# Patient Record
Sex: Male | Born: 2000 | Race: White | Hispanic: No | Marital: Single
Health system: Midwestern US, Community
[De-identification: ages and names within clinical notes are randomized; demographics above are authoritative.]

## PROBLEM LIST (undated history)

## (undated) HISTORY — PX: EYE SURGERY: SHX253

---
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
## 2021-05-09 IMAGING — CT CT ABD-PELV W/ CM
2 of 4 series · 16 of 46 positions shown, 18 images · IV contrast (Omnipaque or Isovue)
Comparison: None.

CLINICAL DATA: 19-year-old male with right lower quadrant abdominal
pain on set tonight.

EXAM:
CT ABDOMEN AND PELVIS WITH CONTRAST
TECHNIQUE: Multidetector CT imaging of the abdomen and pelvis was performed
using the standard protocol following bolus administration of
intravenous contrast.
CONTRAST:  100mL OMNIPAQUE IOHEXOL 300 MG/ML  SOLN

[Series 2: axial st · axial · 0.62mm/px · z∈[-628,-253]mm · 13 of 83 slices shown, 15 images]
[im 4/83  soft-tissue]
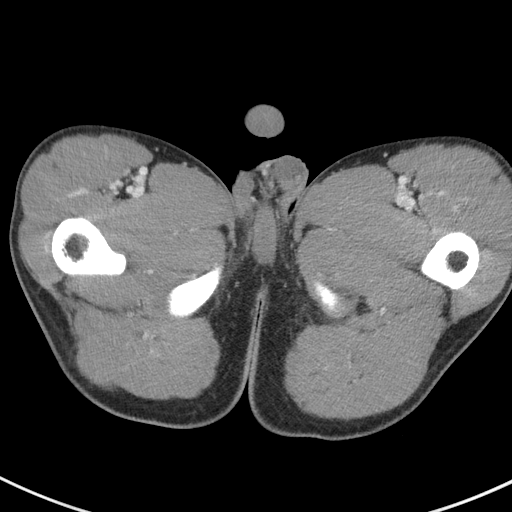
[im 4/83  bone]
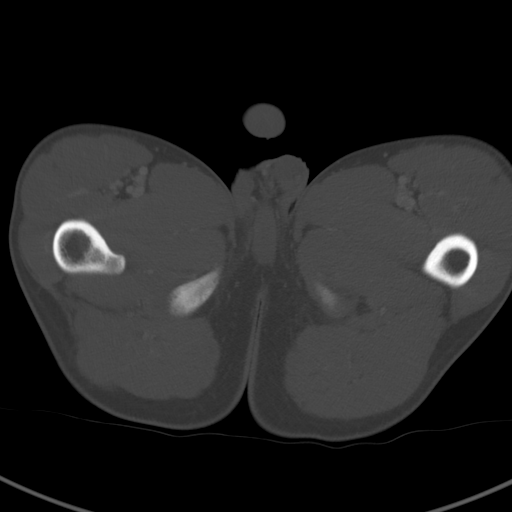
[im 11/83  soft-tissue]
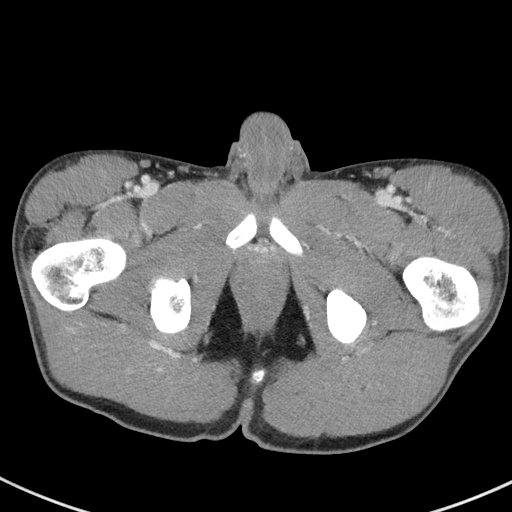
[im 18/83  soft-tissue]
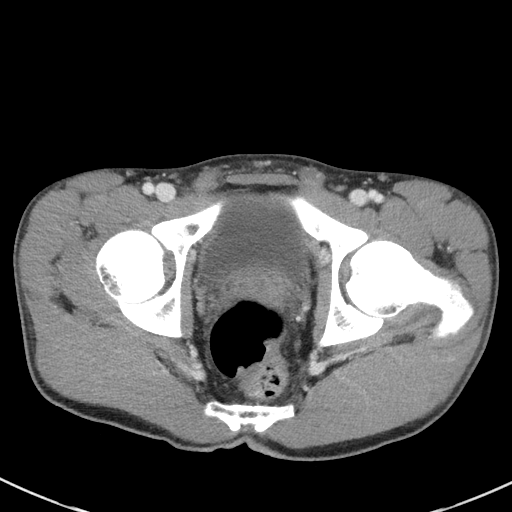
[im 24/83  soft-tissue]
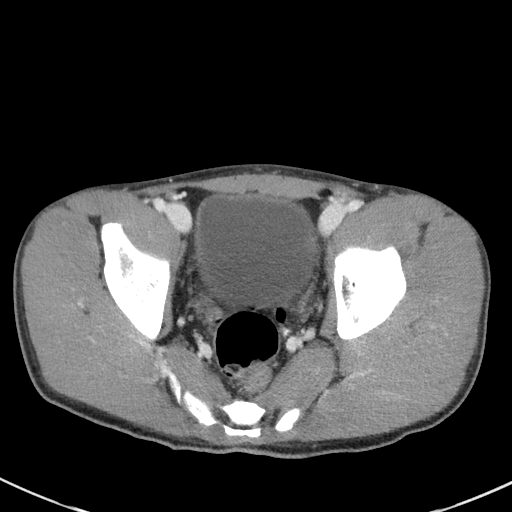
[im 28/83  soft-tissue]
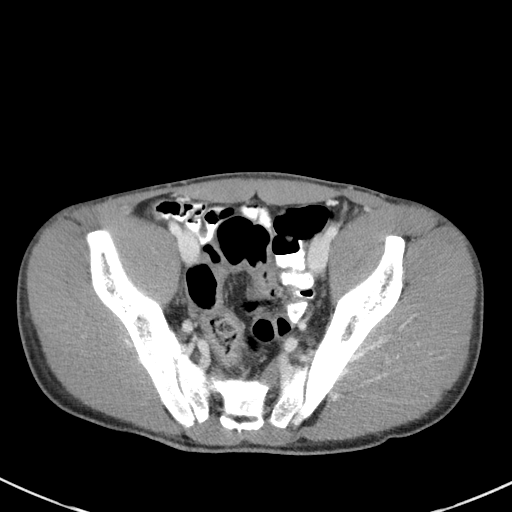
[im 35/83  soft-tissue]
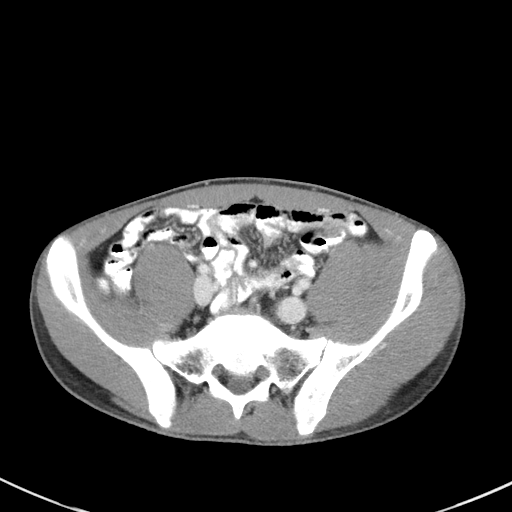
[im 42/83  soft-tissue]
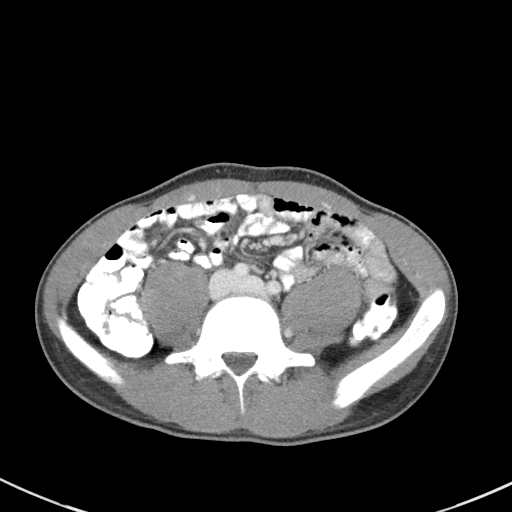
[im 48/83  soft-tissue]
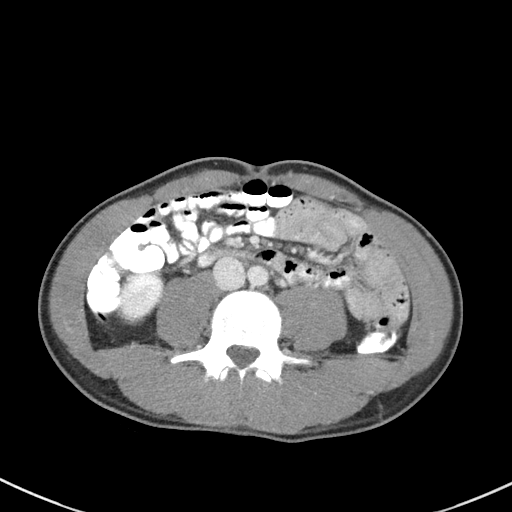
[im 55/83  soft-tissue]
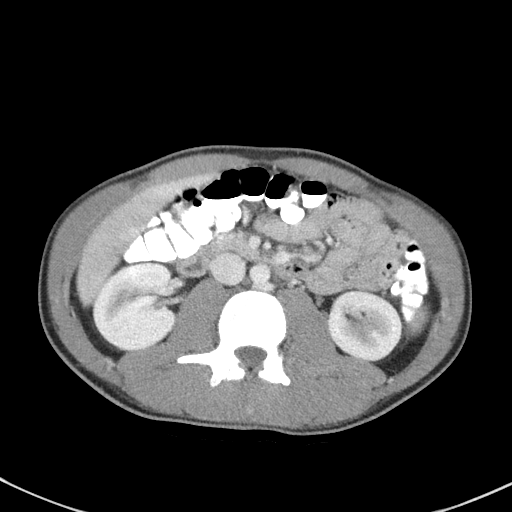
[im 55/83  bone]
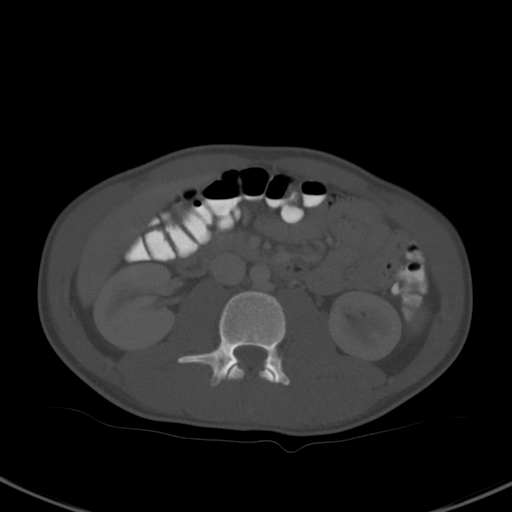
[im 59/83  soft-tissue]
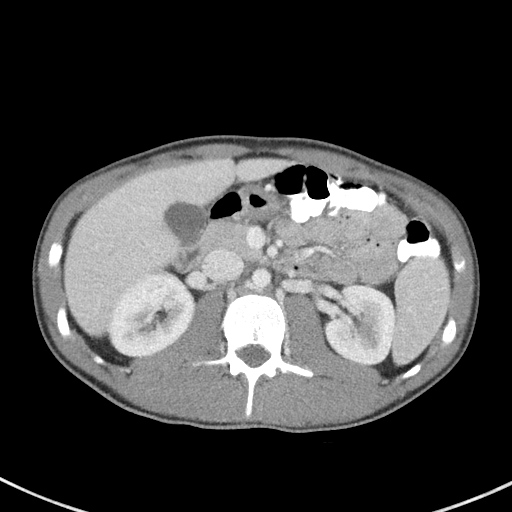
[im 65/83  soft-tissue]
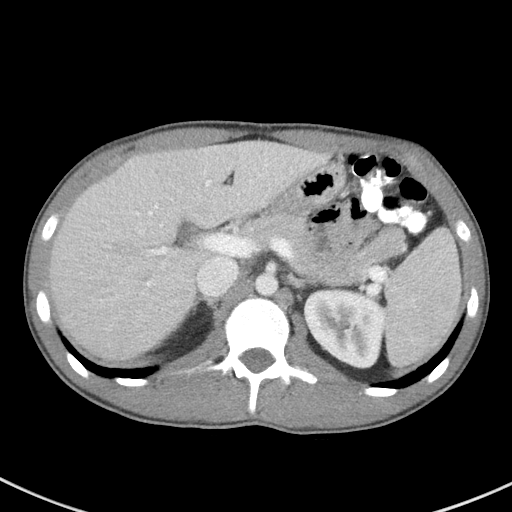
[im 72/83  soft-tissue]
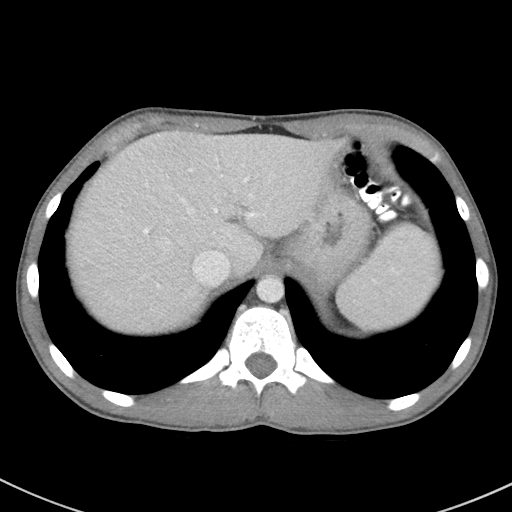
[im 79/83  soft-tissue]
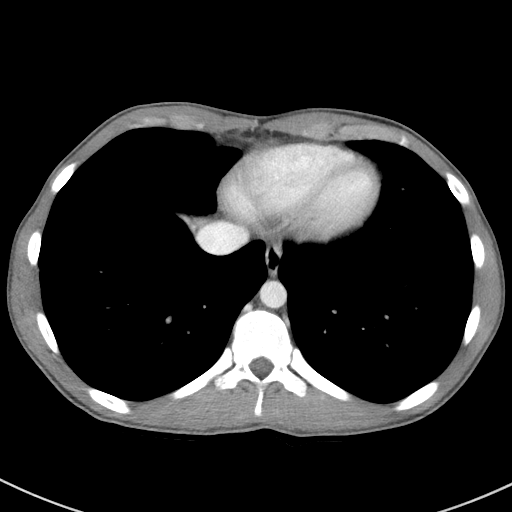

[Series 5: coronal st · coronal · 0.65mm/px · 3 of 81 slices shown]
[im 27/81  soft-tissue]
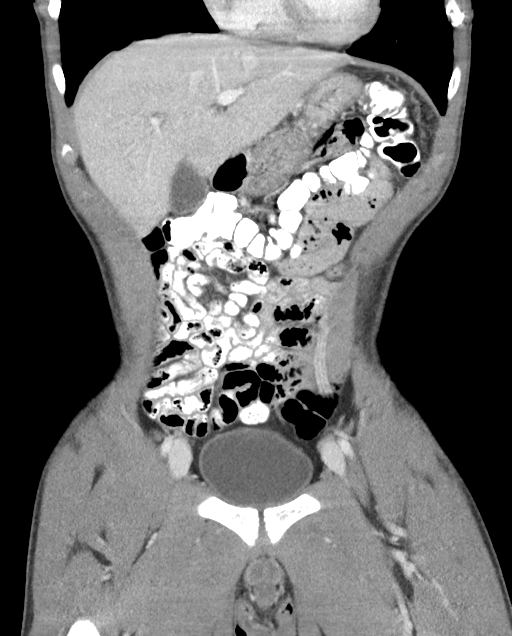
[im 36/81  soft-tissue]
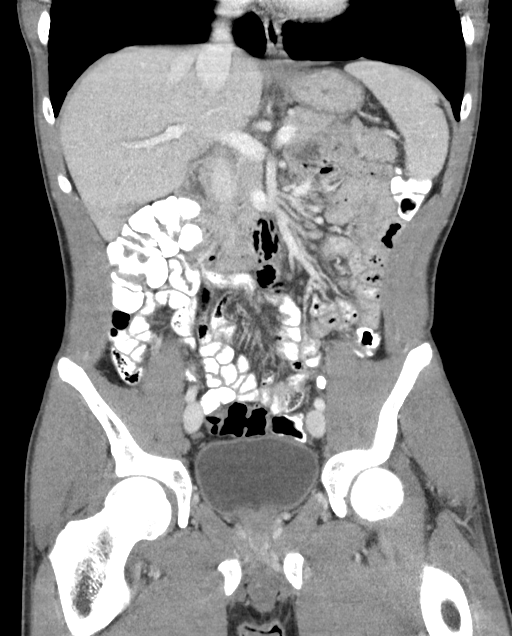
[im 45/81  soft-tissue]
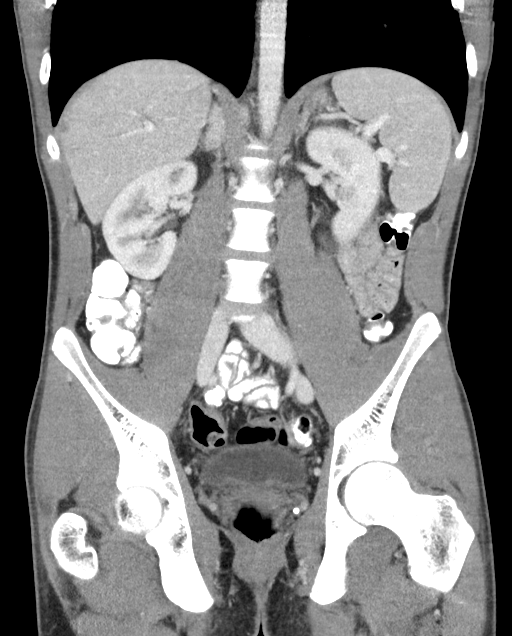

[16 of 46 positions shown; findings below may reference images not displayed]

FINDINGS: Lower chest: Negative; tiny postinflammatory nodule in the right
costophrenic angle on series 4, image 11.

Hepatobiliary: Negative; small peripheral low-density area in the
right liver (series 5, image 44) is too small to characterize but
most likely a benign cyst or hemangioma. Negative gallbladder. No
bile duct enlargement.

Pancreas: Negative.

Spleen: Negative.

Adrenals/Urinary Tract: Normal adrenal glands.

Bilateral renal enhancement is symmetric and within normal limits.
No hydronephrosis, perinephric stranding. No definite
nephrolithiasis. Decompressed ureters. Unremarkable urinary bladder.
Occasional pelvic phleboliths on the left.

Stomach/Bowel: Negative rectum. Oral contrast has reached the
midportion of the redundant sigmoid colon. Descending, transverse,
and ascending colon are normal.

Cecum and terminal ileum identified on coronal image 47. Normal
retrocecal appendix on coronal image 50. No large bowel
inflammation.

Small bowel loops appear within normal limits. Decompressed stomach.
No free air, free fluid.

Vascular/Lymphatic: Major vascular structures in the abdomen and
pelvis appear patent and normal. No lymphadenopathy.

Reproductive: Negative.

Other: No pelvic free fluid.

Musculoskeletal: Negative.
IMPRESSION: Normal appendix. No acute or inflammatory process identified in the
abdomen or pelvis.
